# Patient Record
Sex: Female | Born: 1980 | Race: White | Hispanic: No | Marital: Single | Smoking: Never smoker
Health system: Southern US, Community
[De-identification: ages and names within clinical notes are randomized; demographics above are authoritative.]

## PROBLEM LIST (undated history)

## (undated) HISTORY — PX: APPENDECTOMY: SHX54

---

## 2012-07-01 DIAGNOSIS — Z23 Encounter for immunization: Secondary | ICD-10-CM

## 2013-02-21 ENCOUNTER — Encounter (HOSPITAL_BASED_OUTPATIENT_CLINIC_OR_DEPARTMENT_OTHER): Payer: Self-pay | Admitting: *Deleted

## 2013-02-21 ENCOUNTER — Emergency Department (HOSPITAL_BASED_OUTPATIENT_CLINIC_OR_DEPARTMENT_OTHER)
Admission: EM | Admit: 2013-02-21 | Discharge: 2013-02-21 | Disposition: A | Discharge status: Home / Self Care | Payer: 59 | Attending: Emergency Medicine | Admitting: Emergency Medicine

## 2013-02-21 ENCOUNTER — Emergency Department (HOSPITAL_BASED_OUTPATIENT_CLINIC_OR_DEPARTMENT_OTHER): Payer: 59

## 2013-02-21 DIAGNOSIS — O209 Hemorrhage in early pregnancy, unspecified: Secondary | ICD-10-CM

## 2013-02-21 DIAGNOSIS — R109 Unspecified abdominal pain: Secondary | ICD-10-CM | POA: Insufficient documentation

## 2013-02-21 DIAGNOSIS — N898 Other specified noninflammatory disorders of vagina: Secondary | ICD-10-CM | POA: Insufficient documentation

## 2013-02-21 DIAGNOSIS — O9989 Other specified diseases and conditions complicating pregnancy, childbirth and the puerperium: Secondary | ICD-10-CM | POA: Insufficient documentation

## 2013-02-21 DIAGNOSIS — O4690 Antepartum hemorrhage, unspecified, unspecified trimester: Secondary | ICD-10-CM | POA: Insufficient documentation

## 2013-02-21 LAB — URINALYSIS, ROUTINE W REFLEX MICROSCOPIC
Bilirubin Urine: NEGATIVE
Glucose, UA: NEGATIVE mg/dL
Ketones, ur: NEGATIVE mg/dL
Nitrite: NEGATIVE
Specific Gravity, Urine: 1.019 (ref 1.005–1.030)
pH: 7 (ref 5.0–8.0)

## 2013-02-21 LAB — WET PREP, GENITAL: Trich, Wet Prep: NONE SEEN

## 2013-02-21 LAB — URINE MICROSCOPIC-ADD ON

## 2013-02-21 LAB — ABO/RH: ABO/RH(D): A POS

## 2013-02-21 MED ORDER — ACETAMINOPHEN 325 MG PO TABS
650.0000 mg | ORAL_TABLET | Freq: Once | ORAL | Status: AC
  Administered 2013-02-21: 650 mg via ORAL
  Filled 2013-02-21: qty 2

## 2013-02-21 MED ORDER — HYDROCODONE-ACETAMINOPHEN 5-325 MG PO TABS: 2.0000 | ORAL_TABLET | ORAL | Status: DC | PRN

## 2013-02-21 NOTE — ED Provider Notes (Signed)
History     CSN: [id number]  Arrival date & time 02/21/13  8295   First MD Initiated Contact with Patient 02/21/13 1859      Chief Complaint  Patient presents with  . Vaginal Bleeding    (Consider location/radiation/quality/duration/timing/severity/associated sxs/prior treatment) Patient is a 32 y.o. female presenting with vaginal bleeding. The history is provided by the patient. No language interpreter was used.  Vaginal Bleeding Quality:  Spotting Severity:  Mild Onset quality:  Sudden Duration:  8 hours Timing:  Unable to specify Progression:  Unable to specify Chronicity:  New Relieved by:  Nothing Worsened by:  Nothing tried Associated symptoms: abdominal pain   Pt reports she is early pregnant and she began having spotting today.  Pt reports this is her first pregnancty.   History reviewed. No pertinent past medical history.  Past Surgical History  Procedure Laterality Date  . Appendectomy      History reviewed. No pertinent family history.  History  Substance Use Topics  . Smoking status: Never Smoker   . Smokeless tobacco: Not on file  . Alcohol Use: No    OB History   Grav Para Term Preterm Abortions TAB SAB Ect Mult Living   1               Review of Systems  Gastrointestinal: Positive for abdominal pain.  Genitourinary: Positive for vaginal bleeding.  All other systems reviewed and are negative.    Allergies  Review of patient's allergies indicates no known allergies.  Home Medications  No current outpatient prescriptions on file.  BP 133/72  Pulse 88  Temp(Src) 98.2 F (36.8 C) (Oral)  Resp 18  Ht 5\' 9"  (1.753 m)  Wt 220 lb (99.791 kg)  BMI 32.47 kg/m2  SpO2 97%  LMP 01/05/2013  Physical Exam  Nursing note and vitals reviewed. Constitutional: She is oriented to person, place, and time. She appears well-developed and well-nourished.  HENT:  Head: Normocephalic.  Eyes: Pupils are equal, round, and reactive to light.  Neck:  Normal range of motion. Neck supple.  Cardiovascular: Normal rate and regular rhythm.   Pulmonary/Chest: Effort normal and breath sounds normal.  Abdominal: Soft. Bowel sounds are normal. There is no tenderness.  Genitourinary: Vaginal discharge found.  Small amount of dark blood  Musculoskeletal: Normal range of motion.  Neurological: She is alert and oriented to person, place, and time.  Skin: Skin is warm.  Psychiatric: She has a normal mood and affect.    ED Course  Procedures (including critical care time)  Labs Reviewed  URINALYSIS, ROUTINE W REFLEX MICROSCOPIC  PREGNANCY, URINE   No results found.   1. Vaginal bleeding in pregnancy, first trimester       MDM   Results for orders placed during the hospital encounter of 02/21/13  WET PREP, GENITAL      Result Value Range   Yeast Wet Prep HPF POC NONE SEEN  NONE SEEN   Trich, Wet Prep NONE SEEN  NONE SEEN   Clue Cells Wet Prep HPF POC FEW (*) NONE SEEN   WBC, Wet Prep HPF POC FEW (*) NONE SEEN  URINALYSIS, ROUTINE W REFLEX MICROSCOPIC      Result Value Range   Color, Urine YELLOW  YELLOW   APPearance CLOUDY (*) CLEAR   Specific Gravity, Urine 1.019  1.005 - 1.030   pH 7.0  5.0 - 8.0   Glucose, UA NEGATIVE  NEGATIVE mg/dL   Hgb urine dipstick LARGE (*) NEGATIVE  Bilirubin Urine NEGATIVE  NEGATIVE   Ketones, ur NEGATIVE  NEGATIVE mg/dL   Protein, ur NEGATIVE  NEGATIVE mg/dL   Urobilinogen, UA 1.0  0.0 - 1.0 mg/dL   Nitrite NEGATIVE  NEGATIVE   Leukocytes, UA SMALL (*) NEGATIVE  PREGNANCY, URINE      Result Value Range   Preg Test, Ur POSITIVE (*) NEGATIVE  URINE MICROSCOPIC-ADD ON      Result Value Range   Squamous Epithelial / LPF FEW (*) RARE   WBC, UA 3-6  <3 WBC/hpf   RBC / HPF 3-6  <3 RBC/hpf   Bacteria, UA FEW (*) RARE  HCG, QUANTITATIVE, PREGNANCY      Result Value Range   hCG, Beta Chain, Quant, S 260 (*) <5 mIU/mL  ABO/RH      Result Value Range   ABO/RH(D) A POS     No rh immune globuloin  NOT A RH IMMUNE GLOBULIN CANDIDATE, PT RH POSITIVE     US Ob Comp Less 14 Wks  02/21/2013   *RADIOLOGY REPORT*  Clinical Data: Early pregnancy.  Bleeding.  OBSTETRIC <14 WK [place] AND TRANSVAGINAL OB US  Technique:  Both transabdominal and transvaginal ultrasound examinations were performed for complete evaluation of the gestation as well as the maternal uterus, adnexal regions, and pelvic cul-de-sac.  Transvaginal technique was performed to assess early pregnancy.  Comparison:  None.  Intrauterine gestational sac:  There is a single sac like structure within the uterine fundus along the endometrial canal. Yolk sac: Not evident Embryo: Not identified  MSD: 3.8 mm  five w 1 d             [place] EDC: 10/23/2013  Maternal uterus/adnexae: Trace free fluid.  Normal ovaries.  IMPRESSION:  1. Probable early intrauterine gestational sac, but no yolk sac, fetal pole, or cardiac activity yet visualized.  Recommend follow- up quantitative B-HCG levels and follow-up US in 14 days to confirm and assess viability. This recommendation follows SRU consensus guidelines: Diagnostic Criteria for Nonviable Pregnancy Early in the First Trimester.  Malva Limes Med 2013; 409:8119-14. 2.  Trace free fluid.   Original Report Authenticated By: Genevive Bi, M.D.   US Ob Transvaginal  02/21/2013   *RADIOLOGY REPORT*  Clinical Data: Early pregnancy.  Bleeding.  OBSTETRIC <14 WK [place] AND TRANSVAGINAL OB US  Technique:  Both transabdominal and transvaginal ultrasound examinations were performed for complete evaluation of the gestation as well as the maternal uterus, adnexal regions, and pelvic cul-de-sac.  Transvaginal technique was performed to assess early pregnancy.  Comparison:  None.  Intrauterine gestational sac:  There is a single sac like structure within the uterine fundus along the endometrial canal. Yolk sac: Not evident Embryo: Not identified  MSD: 3.8 mm  five w 1 d             [place] EDC: 10/23/2013  Maternal uterus/adnexae: Trace free  fluid.  Normal ovaries.  IMPRESSION:  1. Probable early intrauterine gestational sac, but no yolk sac, fetal pole, or cardiac activity yet visualized.  Recommend follow- up quantitative B-HCG levels and follow-up US in 14 days to confirm and assess viability. This recommendation follows SRU consensus guidelines: Diagnostic Criteria for Nonviable Pregnancy Early in the First Trimester.  Malva Limes Med 2013; 782:9562-13. 2.  Trace free fluid.   Original Report Authenticated By: Genevive Bi, M.D.    Pt counseled on need for 48 hour repeat qhcg.  Pt is followed by [facility].  Pt advised to see her  or go to women's.   I suspect sab,   Pt given ectopic precautions       Elson Areas, PA-C 02/21/13 2339

## 2013-02-21 NOTE — ED Notes (Signed)
Pt states she was on birth control, but recently found out she was pregnant. Did home preg test on the 11th. No prenatal care as of yet. Today noticed bleeding with wiping. RLQ pain and cramping. Went to UC, told to come here.

## 2013-02-23 LAB — URINE CULTURE

## 2013-02-23 LAB — GC/CHLAMYDIA PROBE AMP: CT Probe RNA: NEGATIVE

## 2013-02-24 NOTE — ED Provider Notes (Signed)
Medical screening examination/treatment/procedure(s) were performed by non-physician practitioner and as supervising physician I was immediately available for consultation/collaboration.     Celene Kras, MD 02/24/13 [phone]

## 2013-03-19 ENCOUNTER — Emergency Department (HOSPITAL_BASED_OUTPATIENT_CLINIC_OR_DEPARTMENT_OTHER): Payer: 59

## 2013-03-19 ENCOUNTER — Emergency Department (HOSPITAL_BASED_OUTPATIENT_CLINIC_OR_DEPARTMENT_OTHER)
Admission: EM | Admit: 2013-03-19 | Discharge: 2013-03-19 | Disposition: A | Discharge status: Home / Self Care | Payer: 59 | Attending: Emergency Medicine | Admitting: Emergency Medicine

## 2013-03-19 ENCOUNTER — Encounter (HOSPITAL_BASED_OUTPATIENT_CLINIC_OR_DEPARTMENT_OTHER): Payer: Self-pay | Admitting: *Deleted

## 2013-03-19 DIAGNOSIS — Y9389 Activity, other specified: Secondary | ICD-10-CM | POA: Insufficient documentation

## 2013-03-19 DIAGNOSIS — S82209A Unspecified fracture of shaft of unspecified tibia, initial encounter for closed fracture: Secondary | ICD-10-CM | POA: Insufficient documentation

## 2013-03-19 DIAGNOSIS — Y9289 Other specified places as the place of occurrence of the external cause: Secondary | ICD-10-CM | POA: Insufficient documentation

## 2013-03-19 DIAGNOSIS — S82409A Unspecified fracture of shaft of unspecified fibula, initial encounter for closed fracture: Secondary | ICD-10-CM | POA: Insufficient documentation

## 2013-03-19 DIAGNOSIS — S82202A Unspecified fracture of shaft of left tibia, initial encounter for closed fracture: Secondary | ICD-10-CM

## 2013-03-19 DIAGNOSIS — X500XXA Overexertion from strenuous movement or load, initial encounter: Secondary | ICD-10-CM | POA: Insufficient documentation

## 2013-03-19 MED ORDER — IBUPROFEN 800 MG PO TABS
800.0000 mg | ORAL_TABLET | Freq: Once | ORAL | Status: AC
  Administered 2013-03-19: 800 mg via ORAL
  Filled 2013-03-19: qty 1

## 2013-03-19 MED ORDER — HYDROCODONE-ACETAMINOPHEN 5-325 MG PO TABS
2.0000 | ORAL_TABLET | ORAL | Status: AC | PRN
Start: 2013-03-19 — End: ?

## 2013-03-19 MED ORDER — IBUPROFEN 800 MG PO TABS: 800.0000 mg | ORAL_TABLET | Freq: Three times a day (TID) | ORAL | Status: AC

## 2013-03-19 NOTE — ED Notes (Signed)
Pedal pulse present in the L foot

## 2013-03-19 NOTE — ED Notes (Signed)
Jumping on a trampoline and twisted her left ankle. Swelling noted.
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
## 2013-03-19 NOTE — ED Provider Notes (Signed)
Medical screening examination/treatment/procedure(s) were performed by non-physician practitioner and as supervising physician I was immediately available for consultation/collaboration.   Charles B. Bernette Mayers, MD 03/19/13 [phone]

## 2013-07-16 ENCOUNTER — Ambulatory Visit (INDEPENDENT_AMBULATORY_CARE_PROVIDER_SITE_OTHER): Payer: 59

## 2013-07-16 DIAGNOSIS — Z23 Encounter for immunization: Secondary | ICD-10-CM

## 2014-05-08 IMAGING — US US OB TRANSVAGINAL
1 series · 14 of 28 positions shown · non-contrast
Comparison: None.

CLINICAL DATA: Early pregnancy.  Bleeding.

OBSTETRIC <14 WK US AND TRANSVAGINAL OB US
TECHNIQUE: Both transabdominal and transvaginal ultrasound
examinations were performed for complete evaluation of the
gestation as well as the maternal uterus, adnexal regions, and
pelvic cul-de-sac.  Transvaginal technique was performed to assess
early pregnancy.

[Series 1: us ob transvaginal · 0.20mm/px · 47 acquisitions, 14 frames shown]
[im 2/47]
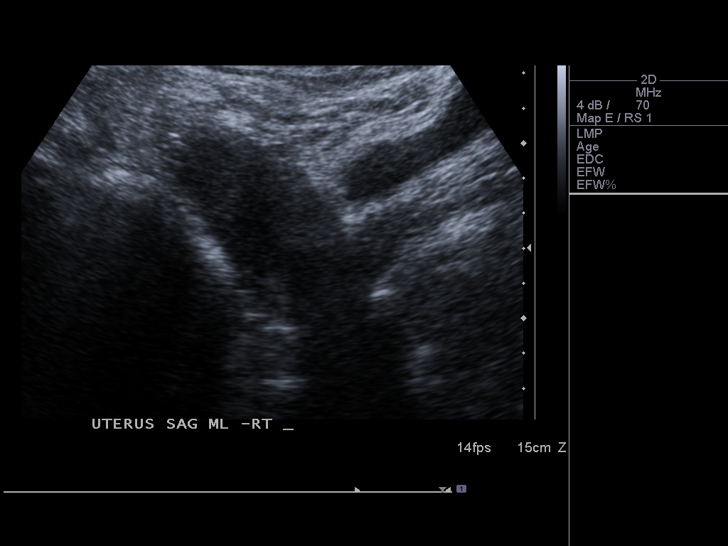
[im 6/47]
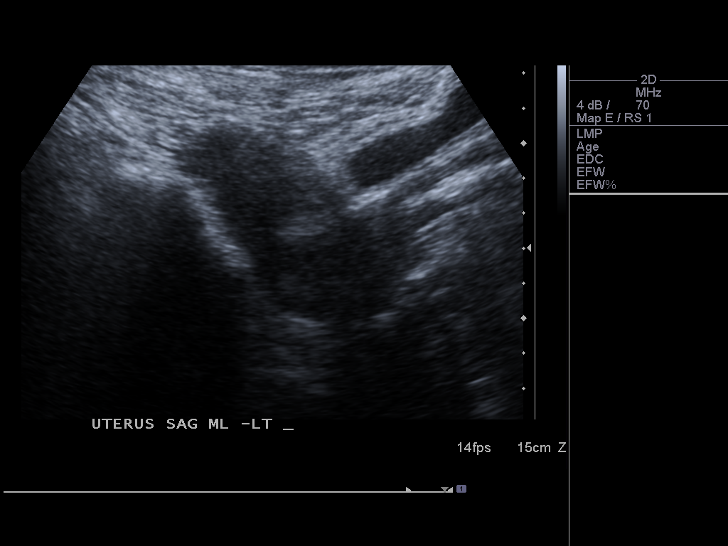
[im 9/47]
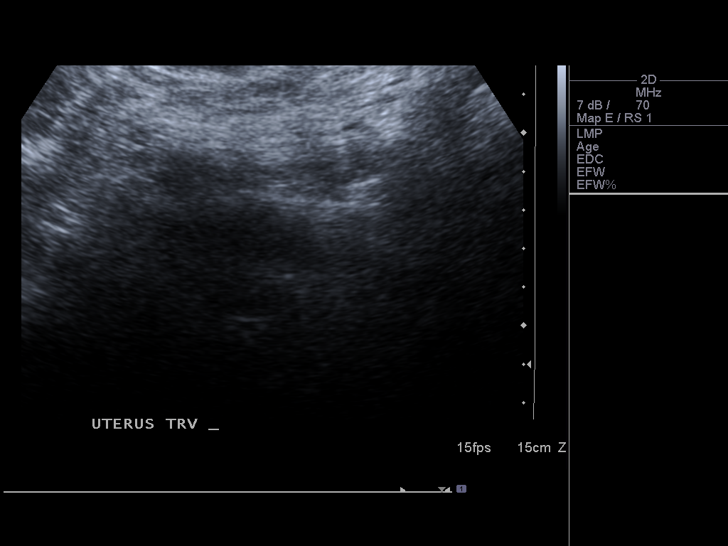
[im 12/47]
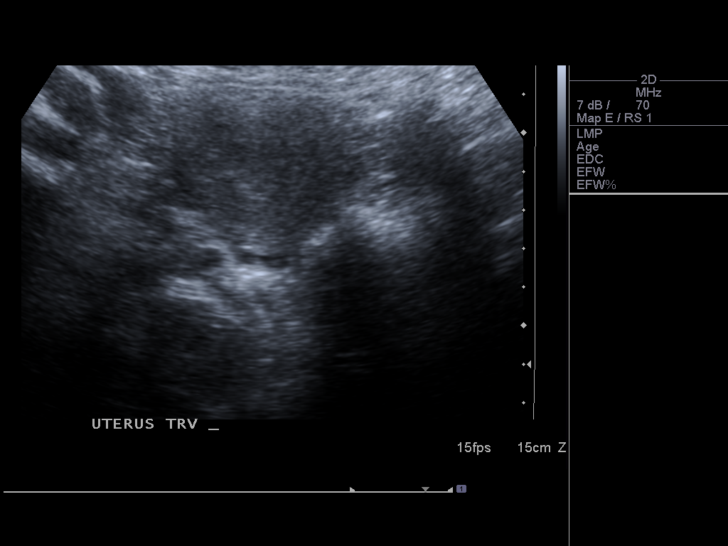
[im 16/47]
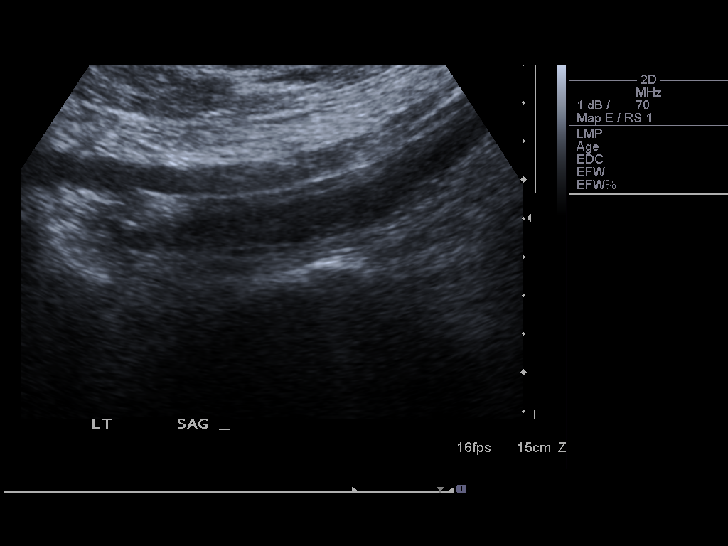
[im 19/47]
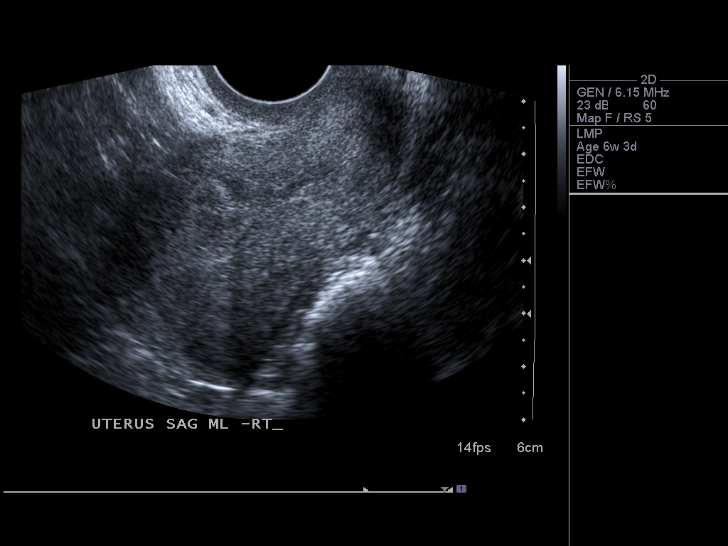
[im 23/47]
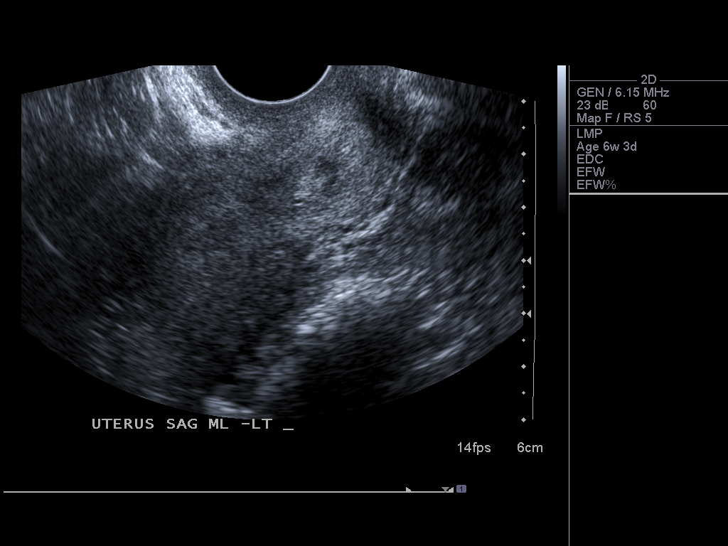
[im 26/47]
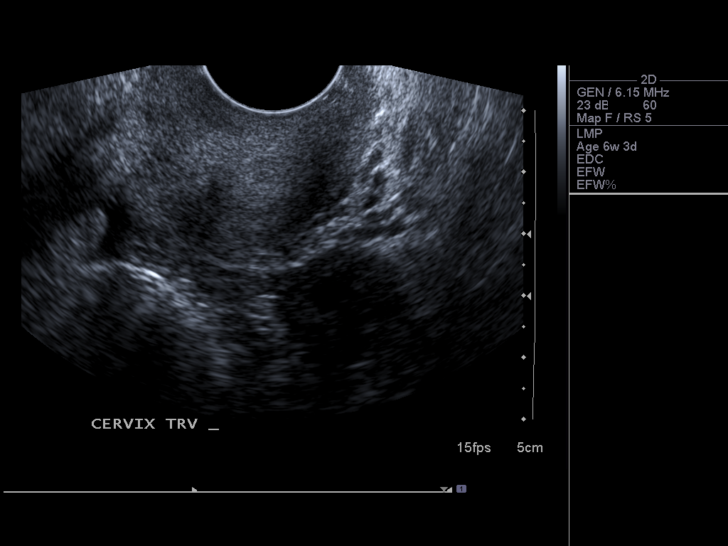
[im 29/47]
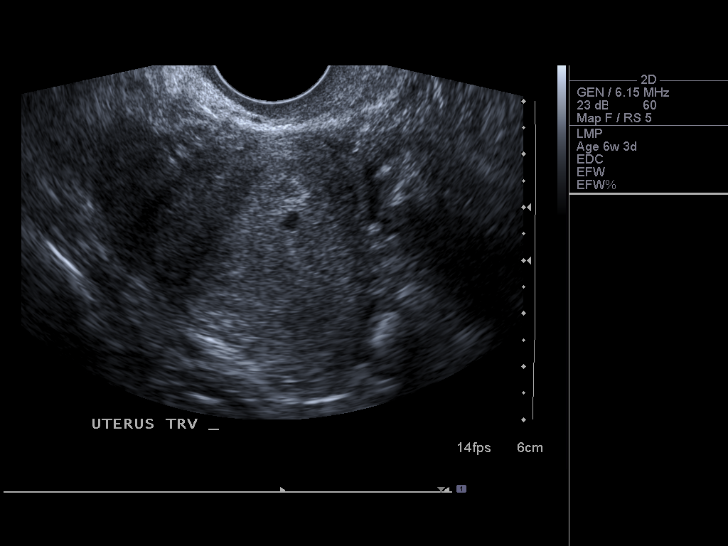
[im 33/47]
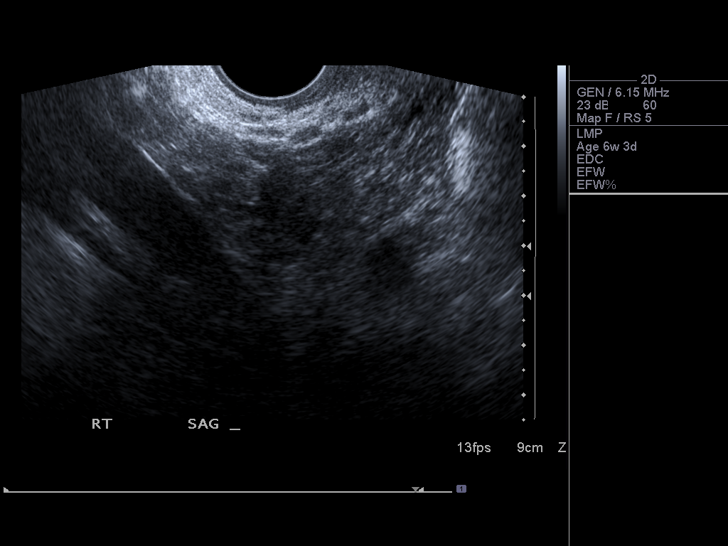
[im 36/47]
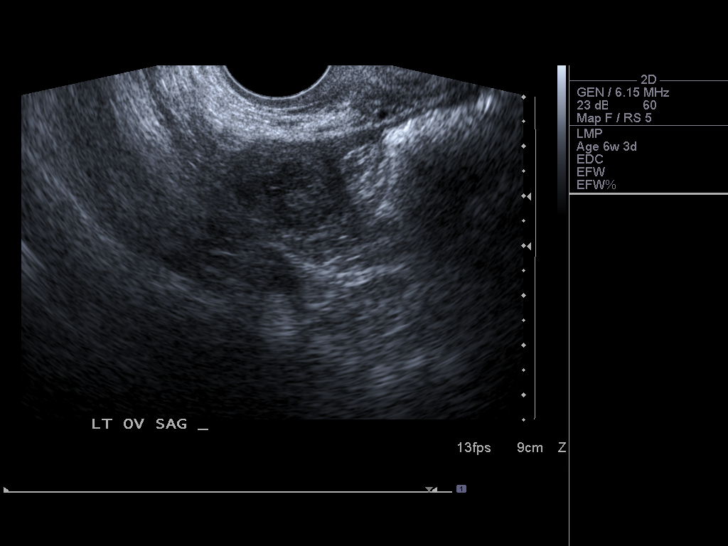
[im 40/47]
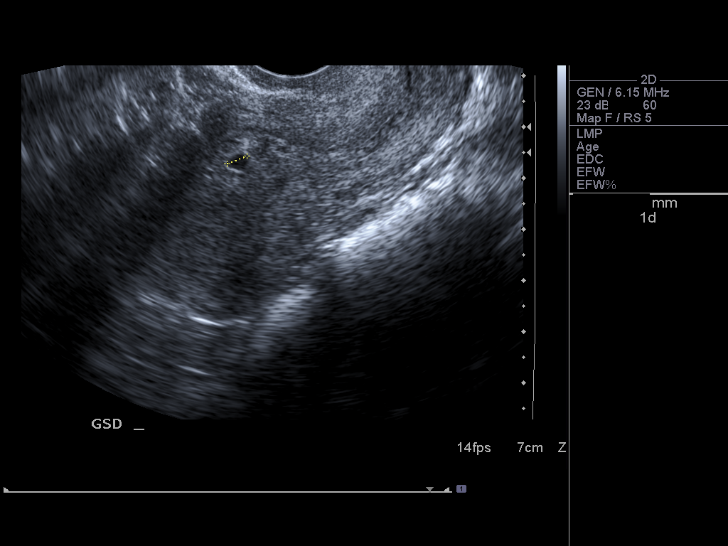
[im 43/47]
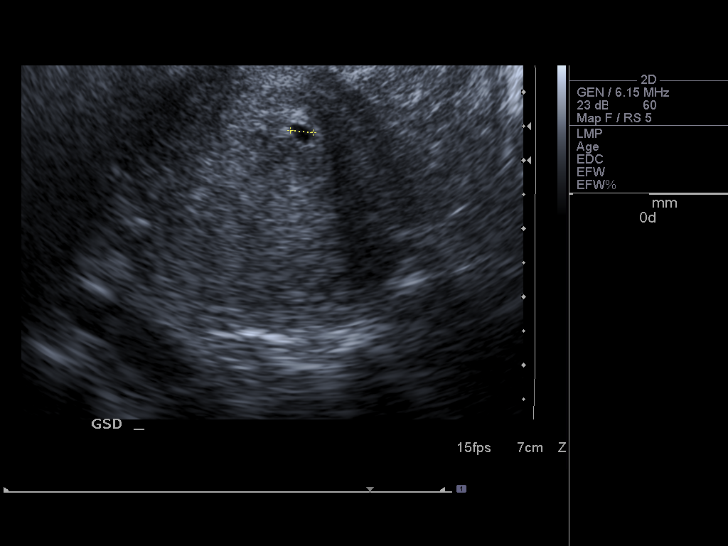
[im 47/47]
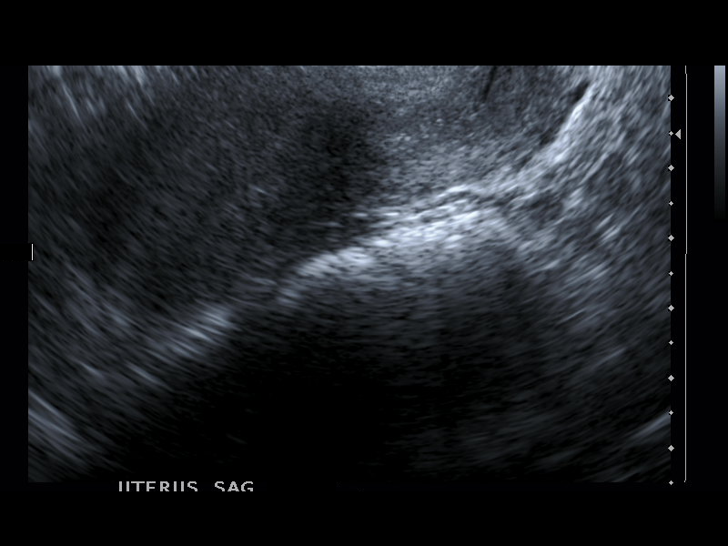

[14 of 28 positions shown; findings below may reference images not displayed]

Intrauterine gestational sac:  There is a single sac like structure
within the uterine fundus along the endometrial canal.
Yolk sac: Not evident
Embryo: Not identified

MSD: 3.8 mm  five w 1 d
            US EDC: 10/23/2013

Maternal uterus/adnexae:
Trace free fluid.  Normal ovaries.
IMPRESSION: 1. Probable early intrauterine gestational sac, but no yolk sac,
fetal pole, or cardiac activity yet visualized.  Recommend follow-
up quantitative B-HCG levels and follow-up US in 14 days to confirm
and assess viability. This recommendation follows SRU consensus
guidelines: Diagnostic Criteria for Nonviable Pregnancy Early in
the First Trimester.  N Engl J Med 2355; [DATE].
2.  Trace free fluid.

## 2014-06-03 IMAGING — CR DG ANKLE COMPLETE 3+V*L*
3 series · 3 of 3 positions shown · non-contrast
Comparison: None.

CLINICAL DATA: twisted ankle, now with lateral ankle pain and
swelling

LEFT ANKLE COMPLETE - 3+ VIEW

[t ankle joint ap left]
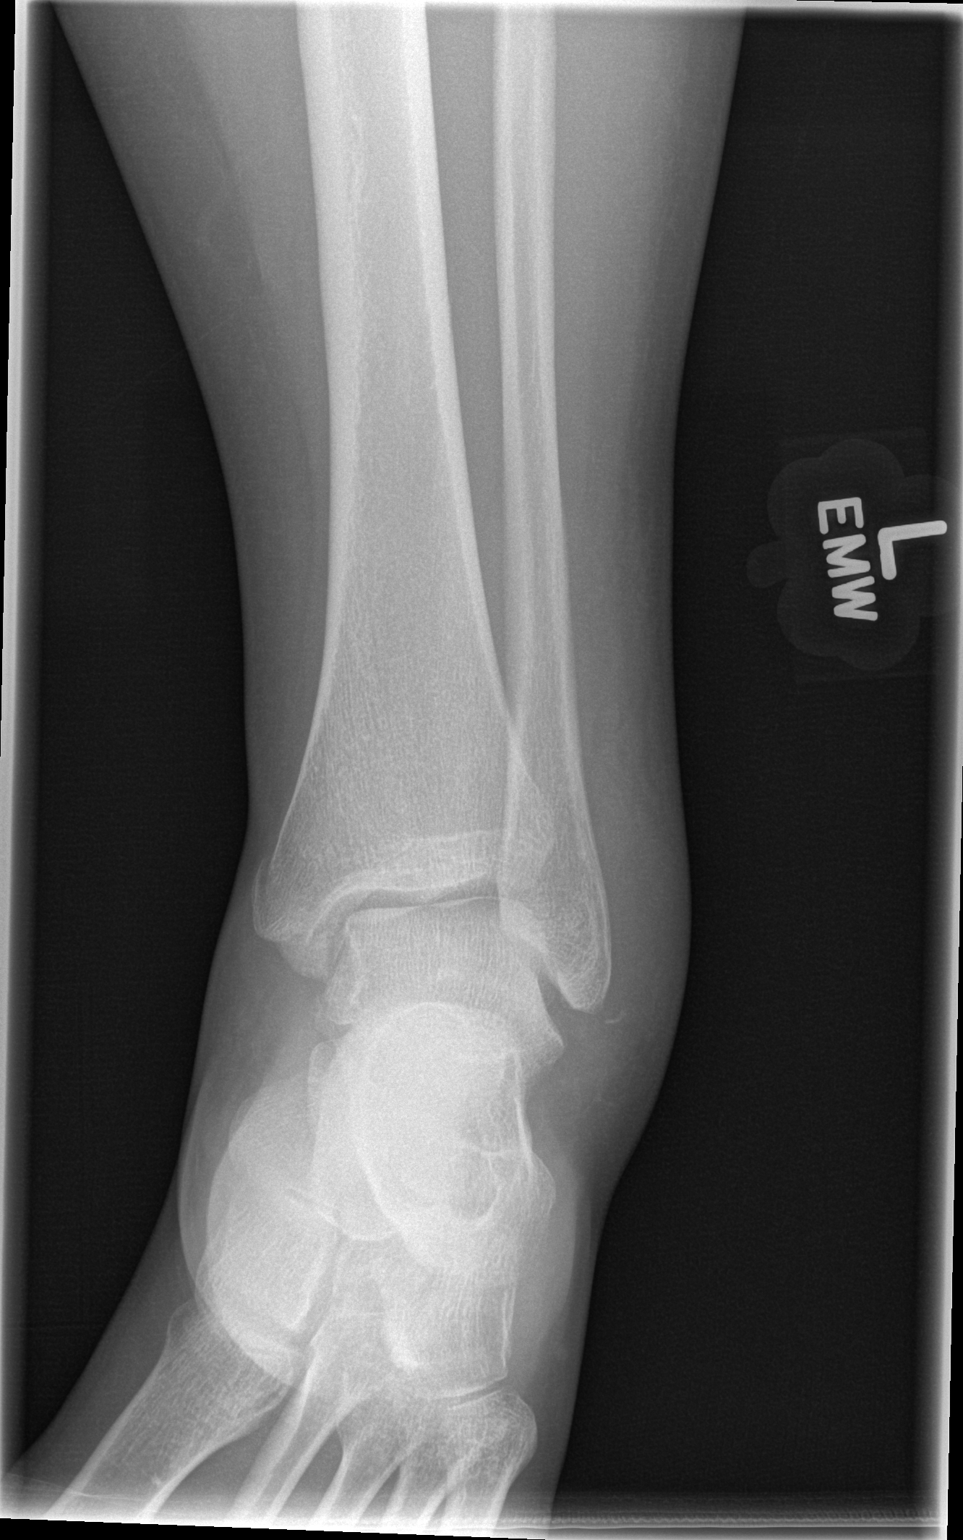

[t ankle joint oblique left]
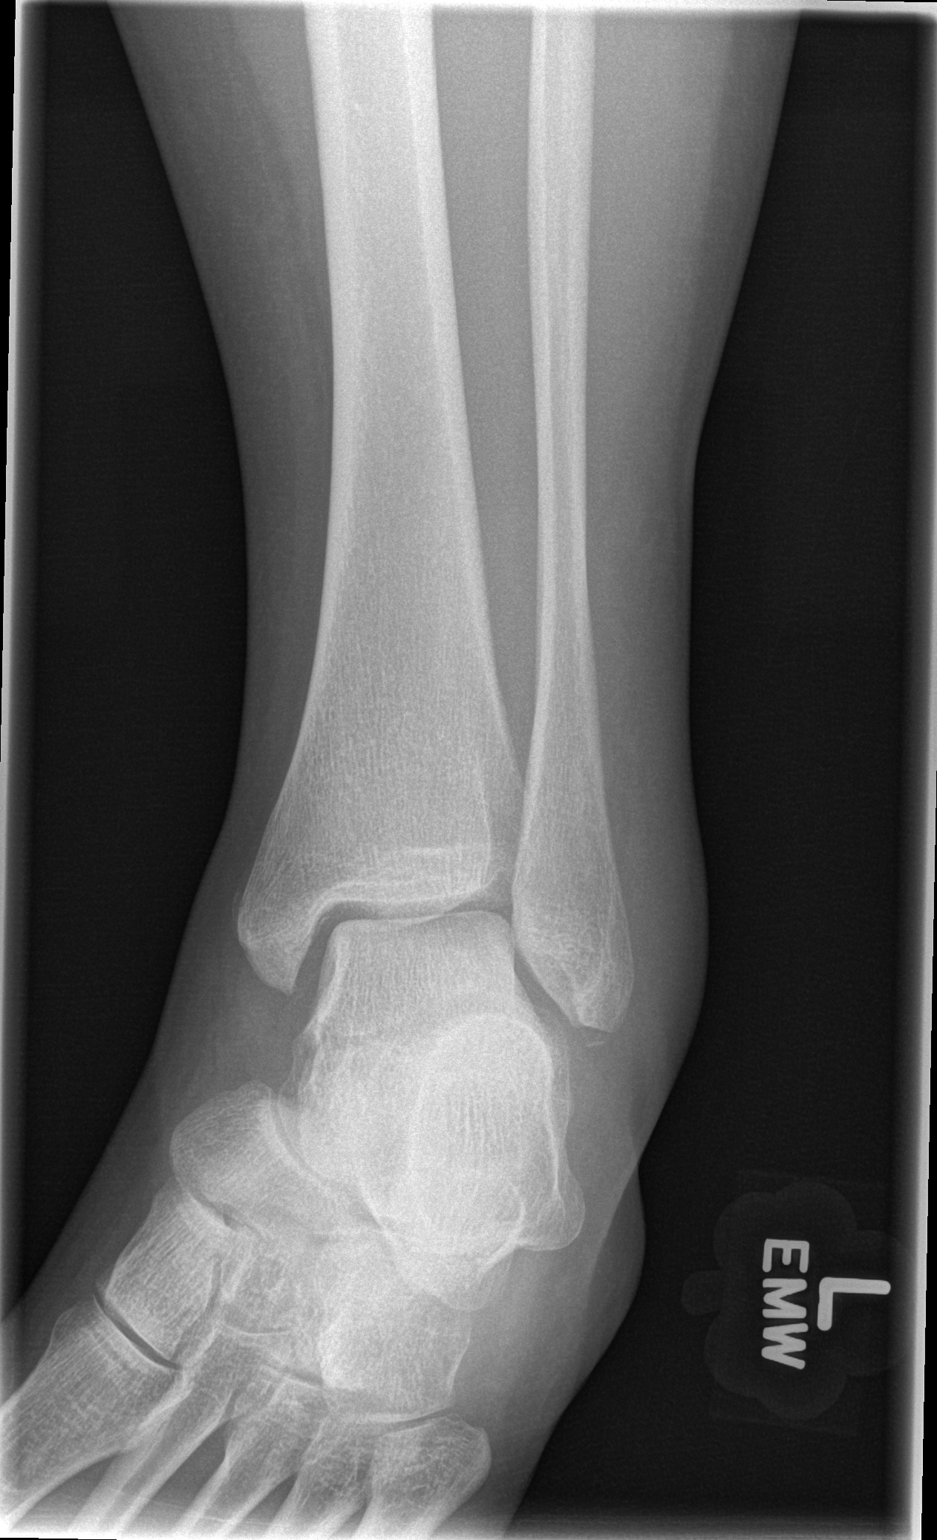

[t ankle joint lat left]
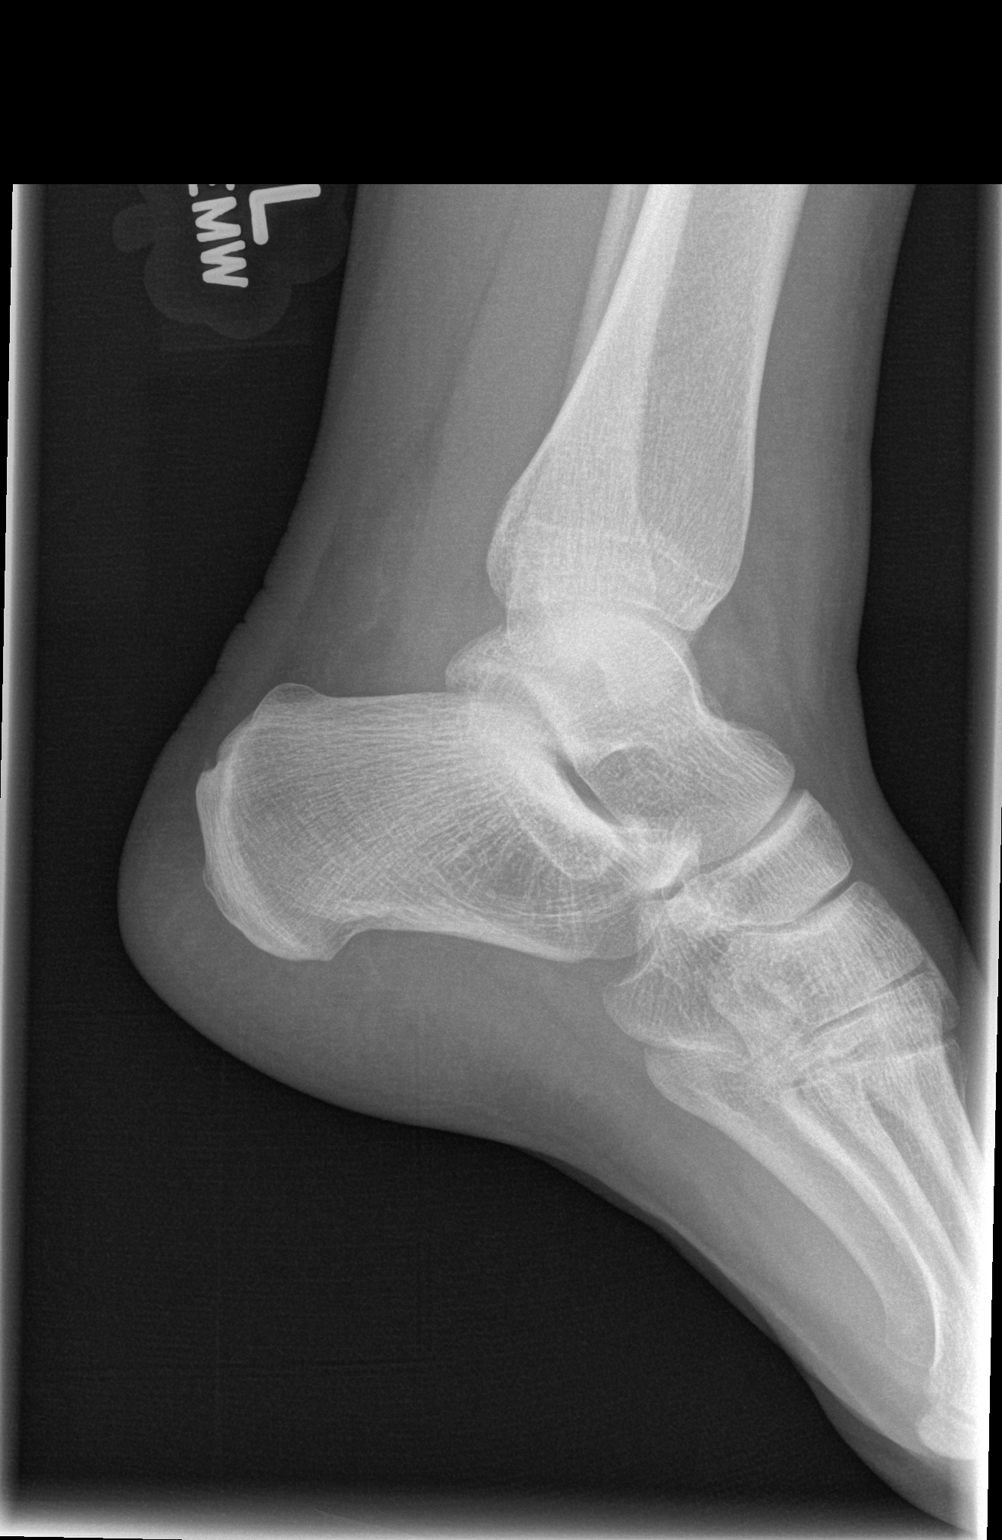

[3 of 3 positions shown; findings below may reference images not displayed]

FINDINGS: There is a minimally displaced fracture involving the medial
malleolus with extension to involve the tibiotalar joint.  There is
a small avulsion fracture involving the distal tip of the fibula.
These findings are associated with a minimal amount of adjacent
soft tissue swelling.  Small ankle joint effusion.  There is
minimal enthesopathic change of the Achilles tendon insertion site.
No radiopaque foreign body.
IMPRESSION: 1.  Minimally displaced fracture involving the medial malleolus
with extension to involve the tibiotalar joint.
2.  Small avulsion fracture involving the distal tip of the fibula.

## 2014-08-01 ENCOUNTER — Encounter (HOSPITAL_BASED_OUTPATIENT_CLINIC_OR_DEPARTMENT_OTHER): Payer: Self-pay | Admitting: *Deleted

## 2019-08-30 ENCOUNTER — Emergency Department (HOSPITAL_COMMUNITY)
Admission: EM | Admit: 2019-08-30 | Discharge: 2019-08-30 | Disposition: A | Discharge status: Home / Self Care | Payer: 59 | Attending: Emergency Medicine | Admitting: Emergency Medicine

## 2019-08-30 ENCOUNTER — Encounter (HOSPITAL_COMMUNITY): Payer: Self-pay | Admitting: Emergency Medicine

## 2019-08-30 ENCOUNTER — Other Ambulatory Visit: Payer: Self-pay

## 2019-08-30 DIAGNOSIS — Z0441 Encounter for examination and observation following alleged adult rape: Secondary | ICD-10-CM | POA: Diagnosis not present
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
## 2019-08-30 NOTE — ED Notes (Signed)
Dawn, SANE RN informed this RN of the pts status and decision. Will inform provider for discharge.
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
## 2019-08-30 NOTE — ED Notes (Signed)
Patient verbalizes understanding of discharge instructions. Opportunity for questioning and answers were provided. Armband removed by staff, pt discharged from ED to home ambulatory.  

## 2019-08-30 NOTE — Discharge Instructions (Addendum)
You have up to 5 days to return for a SANE exam if you wish. You have been given resources as well by SANE nurse.  Please follow up with your PCP regarding your ED visit today.
